# Patient Record
Sex: Female | Born: 1935 | Race: White | Hispanic: No
Health system: Southern US, Community
[De-identification: ages and names within clinical notes are randomized; demographics above are authoritative.]

---

## 2003-02-07 ENCOUNTER — Emergency Department (HOSPITAL_COMMUNITY): Admission: EM | Admit: 2003-02-07 | Discharge: 2003-02-07 | Payer: Self-pay | Admitting: Emergency Medicine

## 2011-05-19 LAB — TROPONIN I: Troponin-I: <0.02 ng/mL

## 2011-05-19 LAB — CBC
HCT: 36.6 %
HGB: 12.6 g/dL
MCH: 33.6 pg
MCHC: 34.5 g/dL
MCV: 97 fL
Platelet: 166 x10 3/mm 3
RBC: 3.76 X10 6/mm 3 — ABNORMAL LOW
RDW: 12.4 %
WBC: 6.2 x10 3/mm 3

## 2011-05-19 LAB — COMPREHENSIVE METABOLIC PANEL WITH GFR
Albumin: 3.5 g/dL
Alkaline Phosphatase: 90 U/L
Anion Gap: 9
BUN: 26 mg/dL — ABNORMAL HIGH
Bilirubin,Total: 0.2 mg/dL
Calcium, Total: 8.4 mg/dL — ABNORMAL LOW
Chloride: 106 mmol/L
Co2: 24 mmol/L
Creatinine: 1.28 mg/dL
EGFR (African American): 47 — ABNORMAL LOW
EGFR (Non-African Amer.): 40 — ABNORMAL LOW
Glucose: 143 mg/dL — ABNORMAL HIGH
Osmolality: 285
Potassium: 3.3 mmol/L — ABNORMAL LOW
SGOT(AST): 34 U/L
SGPT (ALT): 35 U/L
Sodium: 139 mmol/L
Total Protein: 6.9 g/dL

## 2011-05-19 LAB — URINALYSIS, COMPLETE
Bilirubin,UR: NEGATIVE
Blood: NEGATIVE
Glucose,UR: NEGATIVE mg/dL
Hyaline Cast: 3
Ketone: NEGATIVE
Nitrite: NEGATIVE
Ph: 5
Protein: NEGATIVE
RBC,UR: 1 /HPF
Specific Gravity: 1.013
Squamous Epithelial: 1
WBC UR: 8 /HPF

## 2011-05-19 LAB — CK TOTAL AND CKMB (NOT AT ARMC)
CK, Total: 222 U/L — ABNORMAL HIGH
CK-MB: 2.8 ng/mL

## 2011-05-20 ENCOUNTER — Inpatient Hospital Stay: Payer: Self-pay | Admitting: Specialist

## 2011-05-20 LAB — TROPONIN I
Troponin-I: <0.02 ng/mL
Troponin-I: <0.02 ng/mL

## 2011-05-21 LAB — BASIC METABOLIC PANEL
Calcium, Total: 8.2 mg/dL — ABNORMAL LOW (ref 8.5–10.1)
Creatinine: 0.98 mg/dL (ref 0.60–1.30)
EGFR (African American): >60
EGFR (Non-African Amer.): 56 — ABNORMAL LOW
Glucose: 95 mg/dL (ref 65–99)
Osmolality: 286 (ref 275–301)
Potassium: 4.2 mmol/L (ref 3.5–5.1)
Sodium: 143 mmol/L (ref 136–145)

## 2011-05-21 LAB — CBC WITH DIFFERENTIAL/PLATELET
Basophil %: 0.8 %
HCT: 33.1 % — ABNORMAL LOW (ref 35.0–47.0)
Lymphocyte #: 2.2 10*3/uL (ref 1.0–3.6)
MCV: 97 fL (ref 80–100)
Monocyte #: 0.4 x10 3/mm (ref 0.2–0.9)
Neutrophil #: 1.7 10*3/uL (ref 1.4–6.5)
Platelet: 151 10*3/uL (ref 150–440)

## 2013-02-22 IMAGING — US US CAROTID DUPLEX BILAT
1 series · 17 of 24 positions shown · non-contrast
Comparison: none

REASON FOR EXAM: Syncope, Pre-syncope.
COMMENTS:

[Series 1: us carotid duplex bilat · 17 of 71 slices shown]
[im 1/71]
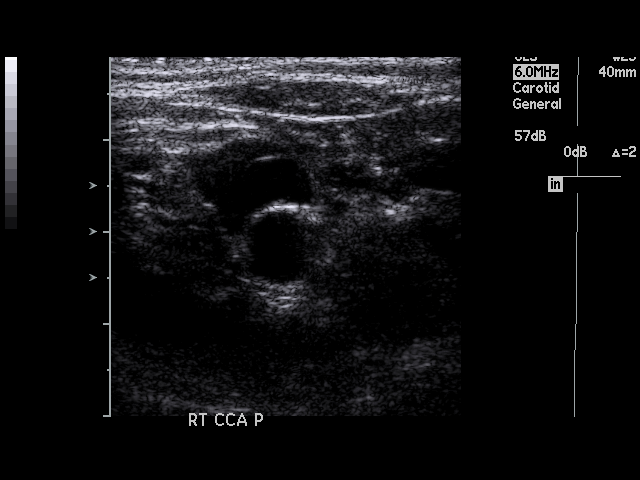
[im 7/71]
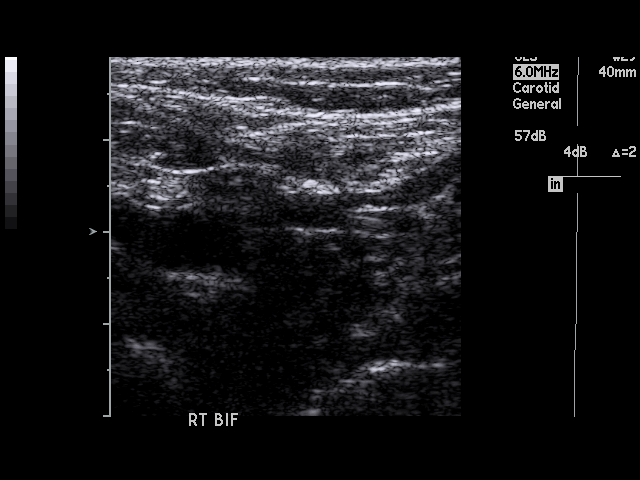
[im 10/71]
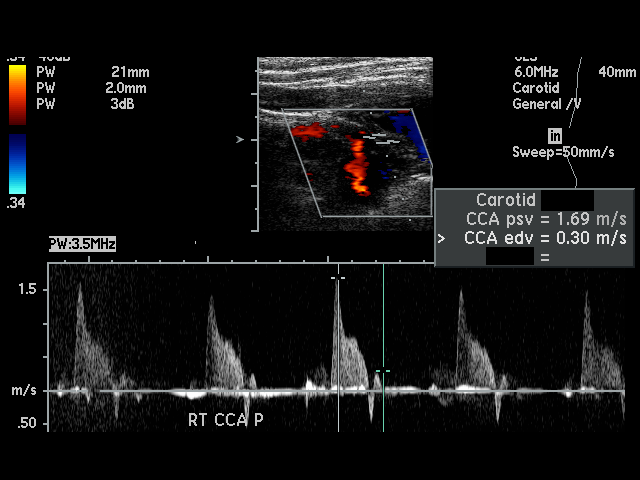
[im 13/71]
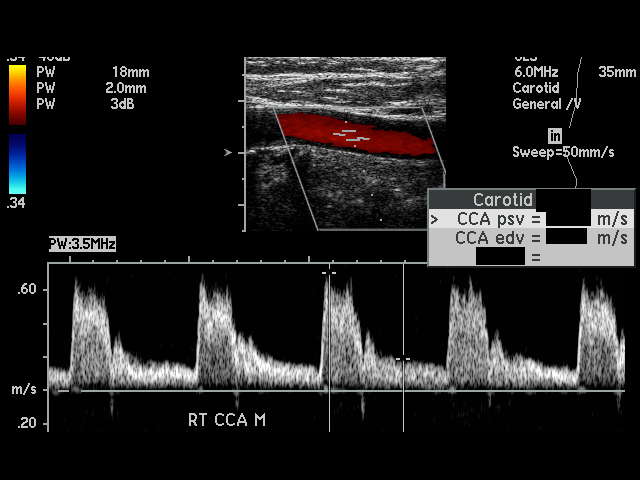
[im 19/71]
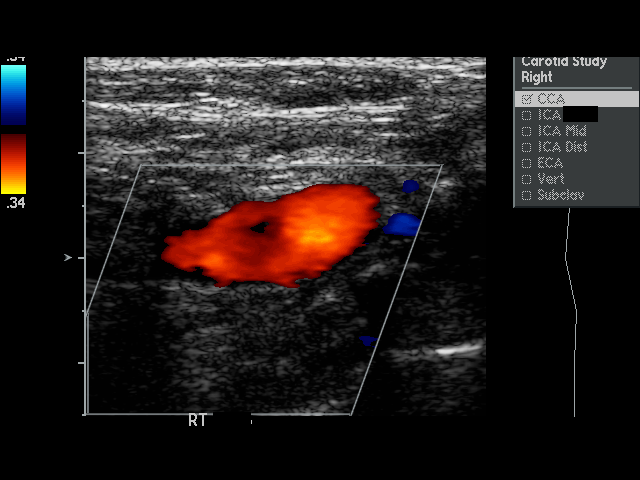
[im 22/71]
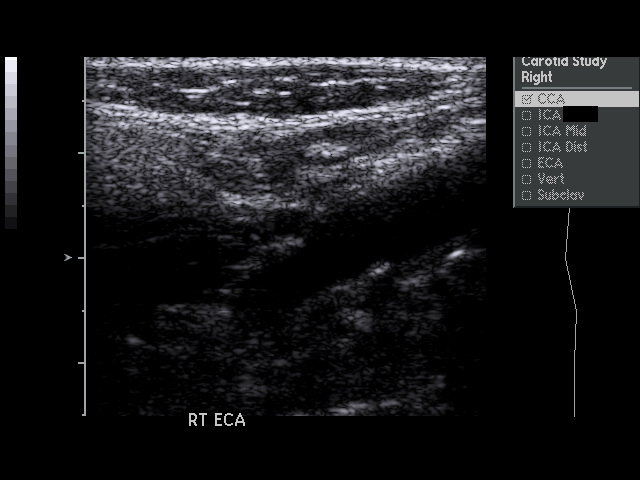
[im 28/71]
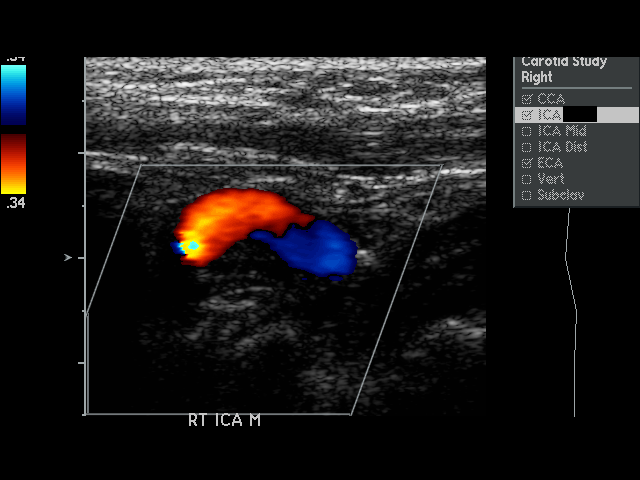
[im 31/71]
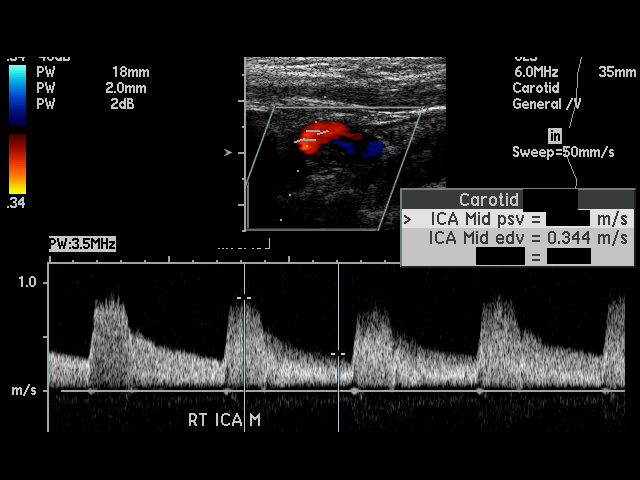
[im 37/71]
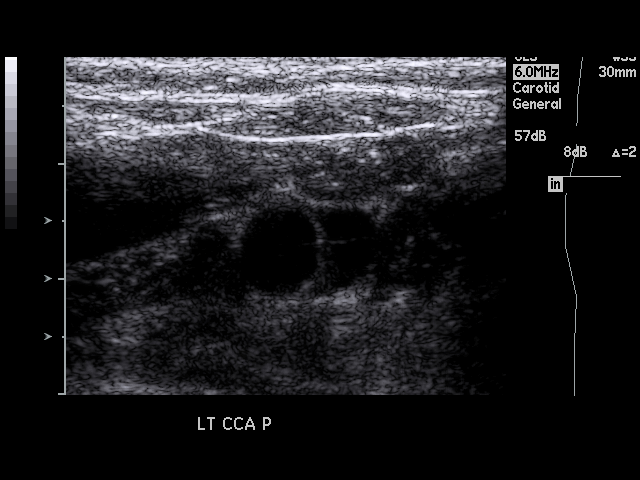
[im 40/71]
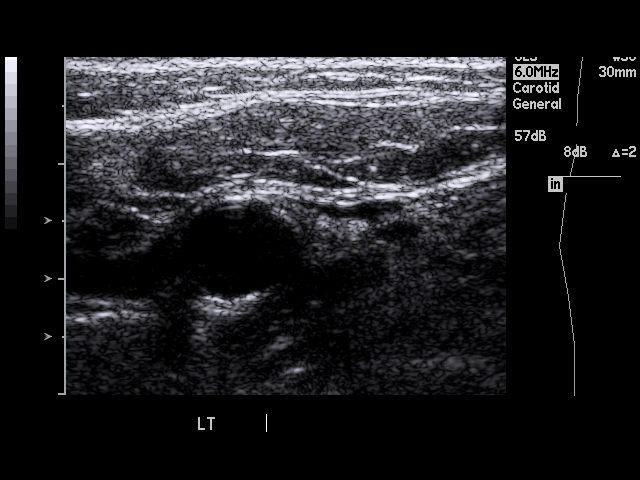
[im 43/71]
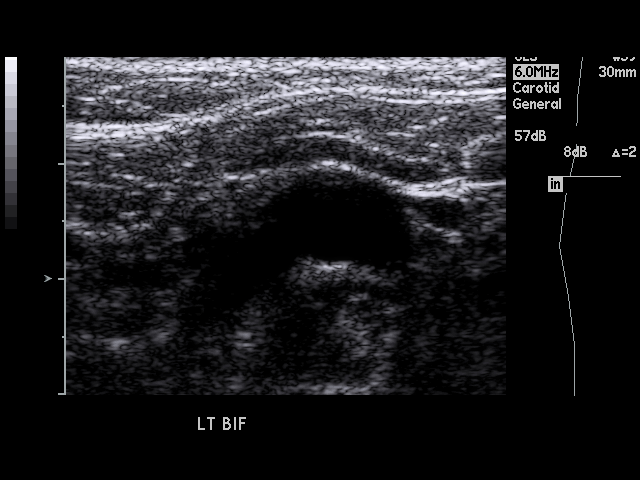
[im 49/71]
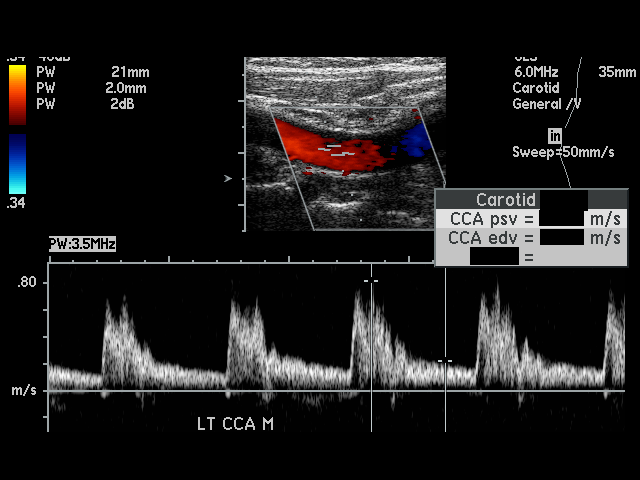
[im 52/71]
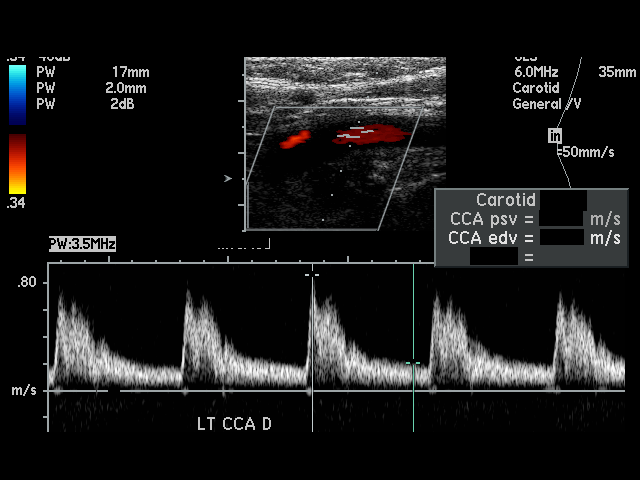
[im 58/71]
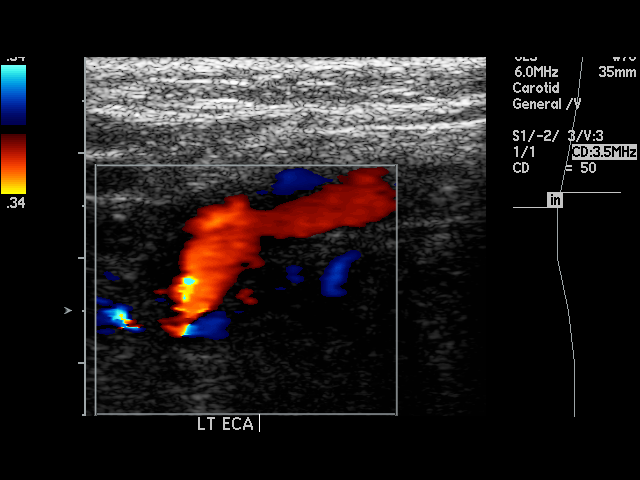
[im 61/71]
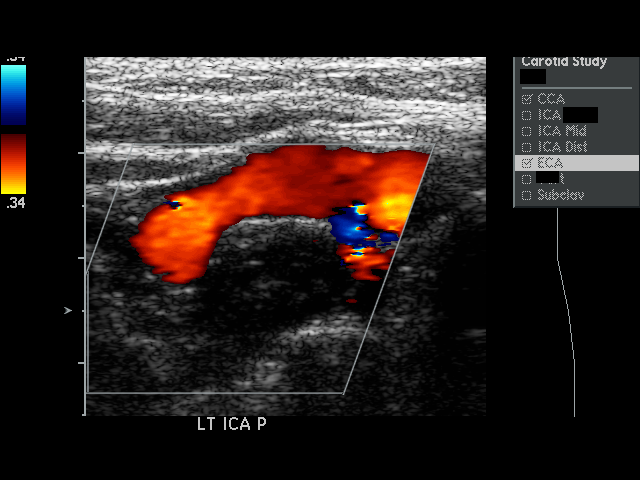
[im 64/71]
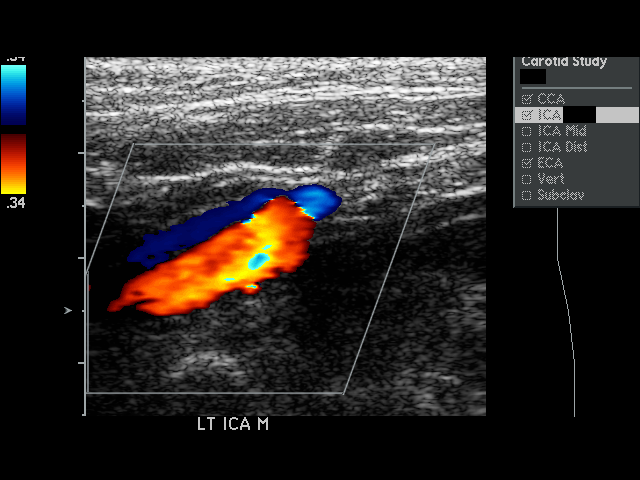
[im 71/71]
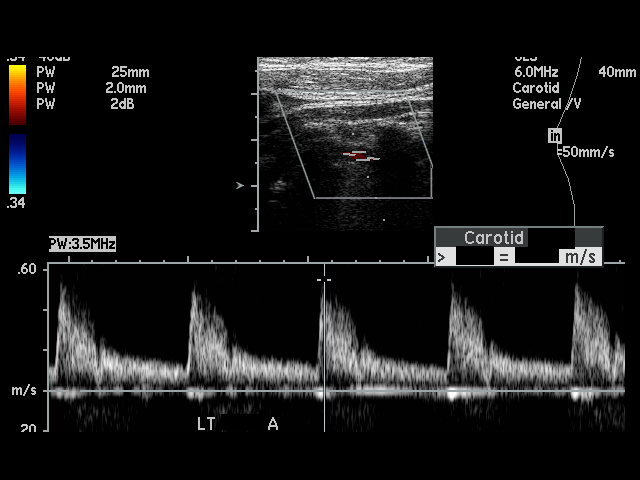

[17 of 24 positions shown; findings below may reference images not displayed]

PROCEDURE:     US  - US CAROTID DOPPLER BILATERAL  - May 20, 2011  [DATE]

RESULT:     Carotid Doppler interrogation demonstrates antegrade flow in the
carotids and vertebral systems. No significant plaque is seen in the right
common or internal carotid nor in the right external carotid. There is some
plaque with smooth calcification in the left carotid bulb without a severe
amount of stenosis visually. The color and spectral Doppler appearance is
normal. The peak systolic velocities are normal. The internal to common
carotid peak systolic velocity ratio is 1.23 on the right and 1.11 on the
left.
IMPRESSION: 1. There is some atherosclerotic disease in the left carotid bulb. There is
no evidence of hemodynamically significant stenosis. No significant
atherosclerotic findings are seen in the right carotid system. Antegrade
flow is noted in the vertebral arteries bilaterally without flow reversal.

[REDACTED]

## 2014-05-02 NOTE — H&P (Signed)
PATIENT NAME:  [name], [name] MR#:  [id number] DATE OF BIRTH:  06/15/1931  DATE OF ADMISSION:  05/20/2011  PRIMARY CARE PHYSICIAN: None local.  REFERRING PHYSICIAN:  Dr. Dolores Frame  CHIEF COMPLAINT: Chest pain.   HISTORY OF PRESENT ILLNESS: This is a 81 year old female with significant past medical history of coronary artery disease status post myocardial infarction in December 2012 status post CABG in [place]. The patient reports she started having chest pain this p.m. The patient reports this chest pain does not resemble the chest pain when she had her myocardial infarction in 2011, but the patient reports she did have a couple episodes of this chest pain last week. As well she reports she did this chest pain earlier this year where she saw her cardiologist, who ordered a stress test for her. So basically she reports she had a nuclear stress test done in February of this year and she reports it was essentially negative as per patient. The patient reports she has been taking sublingual nitroglycerin p.r.n. but she did not take anything for the last few months as pain did not recur after the stress test. The patient reports the chest pain is mid sternal, intermittent, happened at rest. As well it was accompanied by dizziness, lightheadedness, sweating, and nausea. She denies any palpitations or shortness of breath. The patient reports as well she did have a couple episodes of syncope. She reports over the last couple of months she did have 2 or 3 times where she passed out. Basically the patient reports mainly she feels very lightheaded or dizzy. When she sits up her symptoms get better, but she had a couple episodes where she did pass out. The patient had a CT of the brain done in the ED which did not show any acute findings but did show chronic ischemic changes. The patient's cardiac enzymes were negative and EKG in the ED did not show any significant ST changes or T wave changes.   PAST MEDICAL HISTORY:   1. Coronary artery disease status post CABG in December 2011.  2. Hypertension.  3. Hyperlipidemia.  4. Asthma.   PAST SURGICAL HISTORY:  1. Coronary artery bypass grafting 12/2009. 2. Colon resection secondary to colon cancer.  3. Hiatal hernia.  4. Back surgery.  5. Knee surgery.  6. Hysterectomy.  7. Tonsillectomy. 8. Hernia surgery.   SOCIAL HISTORY: The patient lives in [place]. She is retired from a Set designer job. No history of smoking or alcohol or drug use.   FAMILY HISTORY: Significant for coronary artery disease at a young age where her sister had a myocardial infarction in her mid 93s.   HOME MEDICATIONS:  1. Albuterol ipratropium 2 puffs 4 times a day.  2. Aleve 220 mg oral daily at bedtime as needed.  3. Allegra 24 hours 1 tablet daily.  4. Aspirin 81 mg daily.  5. Benadryl 25 mg every six hours as needed.  6. Coreg 6.25 mg oral 2 times a day.  7. Multivitamin 1 tablet oral daily.  8. Nitroglycerin 0.4 mg sublingual every five minutes as needed for pain times three.  9. Simvastatin 40 mg oral at bedtime.  10. The patient reports she has been taking hydrochlorothiazide but does not recall the dose.  ALLERGIES:  Celebrex, Ciprofloxacin, Keflex, penicillin, sulfa drugs, Sulindac, tetracycline, adhesive, latex.   REVIEW OF SYSTEMS: The patient denies any fever, fatigue, or weakness. EYES: Denies any blurry vision, double vision, or pain. ENT: Denies any tinnitus, ear pain, or hearing loss. RESPIRATORY:  Denies any cough, wheezing, or hemoptysis. Has history of asthma. CARDIOVASCULAR: Complains of chest pain, orthopnea, dizziness, lightheadedness, presyncope, and syncope. Denies any palpitations or worsening edema. GI: Denies any vomiting, diarrhea, or abdominal pain. Had nausea. GU: Denies any dysuria, hematuria, or renal colic. Denies any polyuria, polydipsia, heat or cold intolerance. INTEGUMENT: Denies any acne or itching. NEURO: Denies any numbness, dysarthria,  epilepsy, or tremors. Has dizziness.  PSYCH: Denies any depression, anxiety, alcohol or drug use.   LABORATORY, DIAGNOSTIC, AND RADIOLOGICAL DATA: Glucose 143, BUN 26, creatinine 1.28, sodium 139, potassium 3.3, chloride 106, CO2 24, total protein 6.9, albumin 3.5, total bilirubin 0.2, alkaline phosphatase 90, AST 34, ALT 35. Troponin less than 0.02, CK total 222, CK-MB 2.8. White blood cells 6.3, hemoglobin 12.6, hematocrit 36.6, platelets 166. EKG normal sinus rhythm. No ST changes or T-wave inversion.   ASSESSMENT AND PLAN:  1. Chest pain: The patient has history of coronary artery disease, recent CABG, but she had negative stress test in February of this year. There is a possibility of unstable angina. We will cycle cardiac enzymes. We will have the patient on sublingual nitroglycerin as needed. We will check a 2-D echo. Meanwhile, continue the patient on her aspirin and simvastatin and Coreg 6.25. We will consult cardiology as well.  2. Presyncope:  We will check orthostatics in a.m. We will hold the patient's hydrochlorothiazide. The patient had slightly elevated BUN and creatinine. We will hydrate her gently.  We will have her on the telemetry monitor to monitor for any arrhythmias.  3. Hypokalemia: We will replace.  4. Hypertension: We will continue with Coreg 6.25 mg b.i.d.  5. Hyperlipidemia: We will continue with simvastatin.  6. CODE STATUS: The patient is a FULL CODE.  7. We will have the patient on subcutaneous heparin for deep vein thrombosis prophylaxis, 5000 units every 12 hours, and on Protonix for GI prophylaxis.   TOTAL TIME SPENT ON PATIENT CARE: 50 minutes.     ____________________________ Starleen Armsawood S. Adis Sturgill, MD dse:bjt D: 05/20/2011 03:47:06 ET T: 05/20/2011 11:08:17 ET JOB#: [id number]  cc: Starleen Armsawood S. Faviola Klare, MD, <Dictator> Jazion Atteberry Teena IraniS Lindon Kiel MD ELECTRONICALLY SIGNED 05/21/2011 0:09

## 2014-05-02 NOTE — Discharge Summary (Signed)
PATIENT NAME:  ELLISHA, [name] MR#:  [id number] DATE OF BIRTH:  Aug 31, 1931  DATE OF ADMISSION:  05/20/2011 DATE OF DISCHARGE:  05/21/2011  For a detailed note, please look at the history and physical done on admission by Dr. Huey Bienenstock.   DIAGNOSES AT DISCHARGE:  1. Chest pain likely related to underlying coronary artery disease with stable angina.  2. Coronary artery disease status post coronary artery bypass graft with no evidence of acute coronary syndrome.  3. Dizziness, etiology still presently unclear.  4. Hypertension.  5. Hyperlipidemia.   DIET: The patient is being discharged on a low sodium, low fat diet.   ACTIVITY: As tolerated.   FOLLOW-UP: Follow-up with cardiologist in [place], [place] in the next 1 to 2 weeks.   DISCHARGE MEDICATIONS:  1. Combivent inhaler 2 puffs q.i.d. as needed. 2. Allegra 1 tab daily.  3. Aspirin 81 mg daily.  4. Benadryl 25 mg q.6 hours as needed.  5. Coreg 6.25 mg b.i.d.  6. Multivitamin daily.  7. Sublingual nitroglycerin as needed.  8. Simvastatin 40 mg daily.  9. Hydrochlorothiazide 25 mg daily.   CONSULTANTS DURING THE HOSPITAL COURSE: Dr. Adrian Blackwater from Cardiology      PERTINENT STUDIES DONE DURING THE HOSPITAL COURSE:  1. CT scan of the head done without contrast on admission showing chronic small vessel ischemic disease, no acute abnormality.  2. An ultrasound of carotids done showing no hemodynamically significant carotid artery stenosis with antegrade flow in both vertebrals.  3. Cardiac catheterization done on 05/21/2011 showing global left ventricular function to be normal, EF 55%, native coronaries show high-grade lesion in the mid LAD, occluded mid left circumflex and multiple high-grade lesions in the right coronary from proximal to distally prior to bifurcation. All her grafts are patent from LIMA to LAD, SVG graft to OM1.   HOSPITAL COURSE: This is a 81 year old female with medical problems as mentioned above who  presented to the hospital secondary to dizziness and chest pain.  1. Chest pain. Given her recent history of CABG and her being a high risk patient, she was observed overnight on telemetry, had three sets of cardiac markers which were negative. She underwent a cardiac catheterization, results of which are mentioned. She is currently chest pain free and hemodynamically stable. Because of her having some diffuse native coronary artery disease, likely she has chest pain and, as per Cardiology, we are to continue aggressive medical management. She likely will need close follow-up with Cardiology in [place], [place].  2. Dizziness. The exact etiology of this dizziness is currently unclear. She had a CT of the head on admission which was negative. She was not noted to be orthostatic. Her echocardiogram showed normal LV function. Her carotids are negative. She does have previous inner ear problems. If this continues to be a problem, she can go back and see her ENT doctor as an outpatient. She was also observed on telemetry and had no evidence of any acute ventricular or atrial arrhythmias. If she continues to have dizziness as mentioned, she might also benefit from an event/loop monitor as an outpatient.  3. Hypertension. The patient is presently hemodynamically stable. She will resume her Coreg and hydrochlorothiazide upon discharge.  4. Hyperlipidemia. She was maintained on simvastatin. She will resume that upon discharge too.   CODE STATUS: The patient is a FULL CODE.  TIME SPENT: 35 minutes.  ____________________________ Rolly Pancake. Cherlynn Kaiser, MD vjs:drc D: 05/21/2011 15:19:16 ET T: 05/22/2011 14:27:54 ET JOB#: [id number] Houston Siren MD  ELECTRONICALLY SIGNED 05/22/2011 16:44

## 2014-05-02 NOTE — Consult Note (Signed)
PATIENT NAME:  [name], [name] MR#:  [id number] DATE OF BIRTH:  [birth year]  DATE OF CONSULTATION:  05/20/2011  REFERRING PHYSICIAN:   CONSULTING PHYSICIAN:  Rebecca NancyShaukat A. Ajdin Macke, MD  INDICATION FOR CONSULTATION: Chest pain.   HISTORY OF PRESENT ILLNESS: This is a 81 year old white female with a past medical history of CABG in December 2011 at [facility]. She is normally followed by [facility] in [place]. She was visiting her sister. She drove here to see her sister. She started having chest pain a week ago. Chest pain was pressure-type, associated with shortness of breath and radiating to neck. She also had dizziness. She would take nitroglycerin and it  would make her dizzy so she stopped taking nitroglycerin. In the past one week she would get chest pain and she would have to sit down, then the chest pain would ease off.  This time the chest pain got progressively worse and her dizziness also got worse. She was admitted to [facility] twice since her bypass surgery with chest pain. The last time she had a nuclear stress test and was told that it was normal in February 2013. She just saw her cardiologist in March there and she was told everything was fine, but this is the third time she is being admitted to the hospital. This time the chest pain is pressure-type, associated with shortness of breath, and relieved with rest.   PAST MEDICAL HISTORY: History of hypertension and hypercholesterolemia.   SOCIAL HISTORY: She denies smoking or EtOH abuse.   FAMILY HISTORY: Positive for coronary artery disease.   PHYSICAL EXAMINATION:  GENERAL: She is alert, oriented times three, and in no acute distress right now.   VITAL SIGNS:  Vital signs are stable. However, she does have some orthostatic changes.   NECK: No JVD.   LUNGS: Clear.   HEART: Regular rate and rhythm. Normal S1, S2. No audible murmur.   ABDOMEN: Soft, nontender, positive bowel sounds.   EXTREMITIES:  No pedal edema.  LABORATORY, DIAGNOSTIC, AND RADIOLOGICAL DATA:  EKG shows normal sinus rhythm, 86 beats per minute, within normal limits. Cardiac enzymes are negative  Her potassium was 3.3, BUN 26, creatinine 1.28. Troponin negative.   ASSESSMENT AND PLAN: Unstable angina with history of coronary artery disease.   PLAN: Do left heart catheterization and get the records [facility]. Also, we will do carotid Doppler to evaluate her dizziness.   ____________________________ Rebecca NancyShaukat A. Woodie Trusty, MD sak:bjt D: 05/20/2011 09:47:51 ET T: 05/20/2011 09:58:25 ET JOB#: [id number]  cc: Rebecca NancyShaukat A. Murray Guzzetta, MD, <Dictator> Rebecca NancySHAUKAT A Sole Lengacher MD ELECTRONICALLY SIGNED 05/29/2011 11:03
